# Patient Record
Sex: Male | Born: 1970 | Race: White | Hispanic: No | Marital: Single | Smoking: Former smoker
Health system: Southern US, Community
[De-identification: ages and names within clinical notes are randomized; demographics above are authoritative.]

## PROBLEM LIST (undated history)

## (undated) HISTORY — PX: OTHER SURGICAL HISTORY: SHX169

## (undated) HISTORY — PX: CHOLECYSTECTOMY: SHX55

## (undated) HISTORY — PX: CARPAL TUNNEL RELEASE: SHX101

---

## 2010-07-29 ENCOUNTER — Inpatient Hospital Stay (HOSPITAL_COMMUNITY)
Admission: EM | Admit: 2010-07-29 | Discharge: 2010-08-04 | Payer: Self-pay | Source: Home / Self Care | Admitting: Emergency Medicine

## 2010-08-21 ENCOUNTER — Encounter: Admission: RE | Admit: 2010-08-21 | Discharge: 2010-08-21 | Payer: Self-pay | Admitting: Neurosurgery

## 2010-09-18 ENCOUNTER — Encounter
Admission: RE | Admit: 2010-09-18 | Discharge: 2010-09-18 | Payer: Self-pay | Source: Home / Self Care | Attending: Neurosurgery | Admitting: Neurosurgery

## 2010-10-23 ENCOUNTER — Ambulatory Visit (HOSPITAL_COMMUNITY)
Admission: RE | Admit: 2010-10-23 | Discharge: 2010-10-23 | Payer: Self-pay | Source: Home / Self Care | Attending: Neurosurgery | Admitting: Neurosurgery

## 2010-11-29 ENCOUNTER — Other Ambulatory Visit (HOSPITAL_COMMUNITY): Payer: Self-pay | Admitting: Neurosurgery

## 2010-11-29 DIAGNOSIS — M542 Cervicalgia: Secondary | ICD-10-CM

## 2010-12-13 ENCOUNTER — Ambulatory Visit (HOSPITAL_COMMUNITY)
Admission: RE | Admit: 2010-12-13 | Discharge: 2010-12-13 | Disposition: A | Discharge status: Home / Self Care | Payer: Medicaid Other | Source: Ambulatory Visit | Attending: Neurosurgery | Admitting: Neurosurgery

## 2010-12-13 DIAGNOSIS — M5030 Other cervical disc degeneration, unspecified cervical region: Secondary | ICD-10-CM | POA: Insufficient documentation

## 2010-12-13 DIAGNOSIS — R209 Unspecified disturbances of skin sensation: Secondary | ICD-10-CM | POA: Insufficient documentation

## 2010-12-13 DIAGNOSIS — M542 Cervicalgia: Secondary | ICD-10-CM

## 2010-12-13 DIAGNOSIS — M79609 Pain in unspecified limb: Secondary | ICD-10-CM | POA: Insufficient documentation

## 2010-12-13 DIAGNOSIS — M4310 Spondylolisthesis, site unspecified: Secondary | ICD-10-CM | POA: Insufficient documentation

## 2010-12-19 LAB — BASIC METABOLIC PANEL
CO2: 27 mEq/L (ref 19–32)
Calcium: 8.5 mg/dL (ref 8.4–10.5)
Calcium: 9 mg/dL (ref 8.4–10.5)
Chloride: 100 mEq/L (ref 96–112)
Chloride: 102 mEq/L (ref 96–112)
Creatinine, Ser: 0.87 mg/dL (ref 0.4–1.5)
Creatinine, Ser: 0.9 mg/dL (ref 0.4–1.5)
GFR calc Af Amer: >60 mL/min (ref >60)
GFR calc Af Amer: >60 mL/min (ref >60)
GFR calc non Af Amer: >60 mL/min (ref >60)
GFR calc non Af Amer: >60 mL/min (ref >60)
Glucose, Bld: 111 mg/dL — ABNORMAL HIGH (ref 70–99)
Potassium: 4 mEq/L (ref 3.5–5.1)
Sodium: 135 mEq/L (ref 135–145)

## 2010-12-19 LAB — CARDIAC PANEL(CRET KIN+CKTOT+MB+TROPI)
Total CK: 318 U/L — ABNORMAL HIGH (ref 7–232)
Troponin I: <0.01 ng/mL (ref 0.00–0.06)

## 2010-12-19 LAB — CBC
HCT: 34.7 % — ABNORMAL LOW (ref 39.0–52.0)
HCT: 37.7 % — ABNORMAL LOW (ref 39.0–52.0)
Hemoglobin: 11.4 g/dL — ABNORMAL LOW (ref 13.0–17.0)
MCH: 29.1 pg (ref 26.0–34.0)
MCV: 88.7 fL (ref 78.0–100.0)
Platelets: 214 10*3/uL (ref 150–400)
Platelets: 228 10*3/uL (ref 150–400)
RBC: 4.24 MIL/uL (ref 4.22–5.81)
RBC: 4.37 MIL/uL (ref 4.22–5.81)
RDW: 13.6 % (ref 11.5–15.5)
WBC: 11.1 10*3/uL — ABNORMAL HIGH (ref 4.0–10.5)
WBC: 13 10*3/uL — ABNORMAL HIGH (ref 4.0–10.5)

## 2010-12-19 LAB — POCT I-STAT, CHEM 8
Glucose, Bld: 126 mg/dL — ABNORMAL HIGH (ref 70–99)
HCT: 45 % (ref 39.0–52.0)
Hemoglobin: 15.3 g/dL (ref 13.0–17.0)
Potassium: 3.8 mEq/L (ref 3.5–5.1)
Sodium: 140 mEq/L (ref 135–145)

## 2010-12-19 LAB — MRSA PCR SCREENING: MRSA by PCR: NEGATIVE

## 2011-01-03 ENCOUNTER — Encounter (HOSPITAL_COMMUNITY)
Admission: RE | Admit: 2011-01-03 | Discharge: 2011-01-03 | Disposition: A | Discharge status: Home / Self Care | Payer: Medicaid Other | Source: Ambulatory Visit | Attending: Neurosurgery | Admitting: Neurosurgery

## 2011-01-03 DIAGNOSIS — Z01812 Encounter for preprocedural laboratory examination: Secondary | ICD-10-CM | POA: Insufficient documentation

## 2011-01-03 LAB — CBC
HCT: 40.1 % (ref 39.0–52.0)
Hemoglobin: 13.9 g/dL (ref 13.0–17.0)
MCH: 28.6 pg (ref 26.0–34.0)
MCV: 82.5 fL (ref 78.0–100.0)
RBC: 4.86 MIL/uL (ref 4.22–5.81)

## 2011-01-03 LAB — BASIC METABOLIC PANEL
BUN: 15 mg/dL (ref 6–23)
CO2: 32 mEq/L (ref 19–32)
Chloride: 102 mEq/L (ref 96–112)
Creatinine, Ser: 0.81 mg/dL (ref 0.4–1.5)
Glucose, Bld: 107 mg/dL — ABNORMAL HIGH (ref 70–99)

## 2011-01-03 LAB — SURGICAL PCR SCREEN: MRSA, PCR: NEGATIVE

## 2011-01-09 ENCOUNTER — Inpatient Hospital Stay (HOSPITAL_COMMUNITY): Admission: RE | Admit: 2011-01-09 | Payer: Medicaid Other | Source: Ambulatory Visit | Admitting: Neurosurgery

## 2011-02-14 ENCOUNTER — Encounter (HOSPITAL_COMMUNITY)
Admission: RE | Admit: 2011-02-14 | Discharge: 2011-02-14 | Disposition: A | Discharge status: Home / Self Care | Payer: Medicaid Other | Source: Ambulatory Visit | Attending: Neurosurgery | Admitting: Neurosurgery

## 2011-02-14 ENCOUNTER — Ambulatory Visit (HOSPITAL_COMMUNITY)
Admission: RE | Admit: 2011-02-14 | Discharge: 2011-02-14 | Disposition: A | Discharge status: Home / Self Care | Payer: Medicaid Other | Source: Ambulatory Visit | Attending: Neurosurgery | Admitting: Neurosurgery

## 2011-02-14 ENCOUNTER — Other Ambulatory Visit (HOSPITAL_COMMUNITY): Payer: Self-pay | Admitting: Neurosurgery

## 2011-02-14 DIAGNOSIS — G5600 Carpal tunnel syndrome, unspecified upper limb: Secondary | ICD-10-CM | POA: Insufficient documentation

## 2011-02-14 DIAGNOSIS — Z01810 Encounter for preprocedural cardiovascular examination: Secondary | ICD-10-CM | POA: Insufficient documentation

## 2011-02-14 DIAGNOSIS — R0602 Shortness of breath: Secondary | ICD-10-CM | POA: Insufficient documentation

## 2011-02-14 DIAGNOSIS — R062 Wheezing: Secondary | ICD-10-CM | POA: Insufficient documentation

## 2011-02-14 DIAGNOSIS — Z01818 Encounter for other preprocedural examination: Secondary | ICD-10-CM | POA: Insufficient documentation

## 2011-02-14 DIAGNOSIS — M4802 Spinal stenosis, cervical region: Secondary | ICD-10-CM

## 2011-02-14 DIAGNOSIS — Z01812 Encounter for preprocedural laboratory examination: Secondary | ICD-10-CM | POA: Insufficient documentation

## 2011-02-14 LAB — DIFFERENTIAL
Eosinophils Relative: 5 % (ref 0–5)
Lymphocytes Relative: 24 % (ref 12–46)
Lymphs Abs: 2 10*3/uL (ref 0.7–4.0)
Monocytes Absolute: 0.6 10*3/uL (ref 0.1–1.0)
Monocytes Relative: 7 % (ref 3–12)

## 2011-02-14 LAB — CBC
HCT: 42.1 % (ref 39.0–52.0)
MCV: 84.4 fL (ref 78.0–100.0)
RDW: 13.8 % (ref 11.5–15.5)
WBC: 8.2 10*3/uL (ref 4.0–10.5)

## 2011-02-14 LAB — COMPREHENSIVE METABOLIC PANEL
Alkaline Phosphatase: 115 U/L (ref 39–117)
BUN: 21 mg/dL (ref 6–23)
Glucose, Bld: 97 mg/dL (ref 70–99)
Potassium: 4.6 mEq/L (ref 3.5–5.1)
Total Bilirubin: 0.4 mg/dL (ref 0.3–1.2)
Total Protein: 7 g/dL (ref 6.0–8.3)

## 2011-02-14 LAB — SURGICAL PCR SCREEN
MRSA, PCR: NEGATIVE
Staphylococcus aureus: POSITIVE — AB

## 2011-02-20 ENCOUNTER — Inpatient Hospital Stay (HOSPITAL_COMMUNITY)
Admission: RE | Admit: 2011-02-20 | Discharge: 2011-02-23 | DRG: 473 | Disposition: A | Discharge status: Home / Self Care | Payer: Medicaid Other | Source: Ambulatory Visit | Attending: Neurosurgery | Admitting: Neurosurgery

## 2011-02-20 ENCOUNTER — Inpatient Hospital Stay (HOSPITAL_COMMUNITY): Payer: Medicaid Other

## 2011-02-20 DIAGNOSIS — G5600 Carpal tunnel syndrome, unspecified upper limb: Secondary | ICD-10-CM | POA: Diagnosis present

## 2011-02-20 DIAGNOSIS — M47812 Spondylosis without myelopathy or radiculopathy, cervical region: Principal | ICD-10-CM | POA: Diagnosis present

## 2011-02-20 LAB — TYPE AND SCREEN
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
## 2011-03-27 NOTE — Discharge Summary (Signed)
  NAMESEVERINO, Joseph Sullivan              ACCOUNT NO.:  [id number]  MEDICAL RECORD NO.:  [id number]           PATIENT TYPE:  I  LOCATION:  3021                         FACILITY:  [facility]  PHYSICIAN:  Donalee Citrin, M.D.        DATE OF BIRTH:  1971/06/09  DATE OF ADMISSION:  02/20/2011 DATE OF DISCHARGE:  02/23/2011                              DISCHARGE SUMMARY   ADMITTING DIAGNOSIS:  Cervical spondylosis with history of cervical fracture at C7 with foraminotomies at C7 and C8 and posterior spinal fusion at C6 to T1 with a carpal tunnel release.  HOSPITAL COURSE:  The patient was admitted as an EMA, went to operating room, underwent the aforementioned procedure.  Postoperatively, the patient did very well, went to recovery room floor.  On the floor, the patient's pain significantly improved.  He was ambulating and voiding spontaneously.  He was able to be discharged home and was scheduled to follow up in approximately 2 weeks.  At the time of discharge, the patient is tolerating regular diet.  Incisions were clean.          ______________________________ Donalee Citrin, M.D.     GC/MEDQ  D:  03/08/2011  T:  03/08/2011  Job:  [id number]  Electronically Signed by Donalee Citrin M.D. on 03/27/2011 08:35:46 AM

## 2011-04-02 ENCOUNTER — Ambulatory Visit
Admission: RE | Admit: 2011-04-02 | Discharge: 2011-04-02 | Disposition: A | Discharge status: Home / Self Care | Payer: Medicaid Other | Source: Ambulatory Visit | Attending: Neurosurgery | Admitting: Neurosurgery

## 2011-04-02 ENCOUNTER — Other Ambulatory Visit: Payer: Self-pay | Admitting: Neurosurgery

## 2011-04-02 DIAGNOSIS — M542 Cervicalgia: Secondary | ICD-10-CM

## 2011-05-22 ENCOUNTER — Other Ambulatory Visit: Payer: Self-pay | Admitting: Neurosurgery

## 2011-05-22 ENCOUNTER — Ambulatory Visit
Admission: RE | Admit: 2011-05-22 | Discharge: 2011-05-22 | Disposition: A | Discharge status: Home / Self Care | Payer: Medicaid Other | Source: Ambulatory Visit | Attending: Neurosurgery | Admitting: Neurosurgery

## 2011-05-22 DIAGNOSIS — M542 Cervicalgia: Secondary | ICD-10-CM

## 2011-06-03 ENCOUNTER — Ambulatory Visit: Payer: Medicaid Other | Attending: Neurosurgery

## 2011-06-13 ENCOUNTER — Ambulatory Visit: Payer: Medicaid Other | Attending: Neurosurgery | Admitting: Rehabilitative and Restorative Service Providers"

## 2011-06-13 DIAGNOSIS — IMO0001 Reserved for inherently not codable concepts without codable children: Secondary | ICD-10-CM | POA: Insufficient documentation

## 2011-06-13 DIAGNOSIS — M2569 Stiffness of other specified joint, not elsewhere classified: Secondary | ICD-10-CM | POA: Insufficient documentation

## 2011-06-13 DIAGNOSIS — R293 Abnormal posture: Secondary | ICD-10-CM | POA: Insufficient documentation

## 2011-06-13 DIAGNOSIS — M542 Cervicalgia: Secondary | ICD-10-CM | POA: Insufficient documentation

## 2011-06-20 ENCOUNTER — Ambulatory Visit: Payer: Medicaid Other | Admitting: Rehabilitative and Restorative Service Providers"

## 2011-06-26 ENCOUNTER — Ambulatory Visit: Payer: Medicaid Other | Admitting: Rehabilitative and Restorative Service Providers"

## 2011-07-02 ENCOUNTER — Ambulatory Visit: Payer: Medicaid Other | Admitting: Rehabilitative and Restorative Service Providers"

## 2011-07-09 ENCOUNTER — Ambulatory Visit: Payer: Medicaid Other | Attending: Neurosurgery | Admitting: Rehabilitative and Restorative Service Providers"

## 2011-07-09 DIAGNOSIS — IMO0001 Reserved for inherently not codable concepts without codable children: Secondary | ICD-10-CM | POA: Insufficient documentation

## 2011-07-09 DIAGNOSIS — M2569 Stiffness of other specified joint, not elsewhere classified: Secondary | ICD-10-CM | POA: Insufficient documentation

## 2011-07-09 DIAGNOSIS — M542 Cervicalgia: Secondary | ICD-10-CM | POA: Insufficient documentation

## 2011-07-09 DIAGNOSIS — R293 Abnormal posture: Secondary | ICD-10-CM | POA: Insufficient documentation

## 2011-07-12 ENCOUNTER — Ambulatory Visit: Payer: Medicaid Other | Admitting: Rehabilitative and Restorative Service Providers"

## 2011-07-16 ENCOUNTER — Ambulatory Visit: Payer: Medicaid Other | Admitting: Rehabilitative and Restorative Service Providers"

## 2011-07-18 ENCOUNTER — Ambulatory Visit: Payer: Medicaid Other | Admitting: Rehabilitative and Restorative Service Providers"

## 2011-07-22 ENCOUNTER — Ambulatory Visit: Payer: Medicaid Other | Admitting: Rehabilitative and Restorative Service Providers"

## 2011-07-24 ENCOUNTER — Ambulatory Visit: Payer: Medicaid Other | Admitting: Rehabilitative and Restorative Service Providers"

## 2011-10-09 ENCOUNTER — Ambulatory Visit
Admission: RE | Admit: 2011-10-09 | Discharge: 2011-10-09 | Disposition: A | Discharge status: Home / Self Care | Payer: No Typology Code available for payment source | Source: Ambulatory Visit | Attending: Neurosurgery | Admitting: Neurosurgery

## 2011-10-09 ENCOUNTER — Other Ambulatory Visit: Payer: Self-pay | Admitting: Neurosurgery

## 2011-10-09 DIAGNOSIS — M542 Cervicalgia: Secondary | ICD-10-CM
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
## 2013-05-31 ENCOUNTER — Ambulatory Visit: Payer: Self-pay | Admitting: Emergency Medicine

## 2013-05-31 LAB — DOT URINE DIP
Blood: NEGATIVE
Protein: NEGATIVE
Specific Gravity: >=1.03 (ref 1.003–1.030)

## 2013-07-08 ENCOUNTER — Encounter (HOSPITAL_COMMUNITY): Payer: Self-pay | Admitting: Adult Health

## 2013-07-08 ENCOUNTER — Emergency Department (HOSPITAL_COMMUNITY)
Admission: EM | Admit: 2013-07-08 | Discharge: 2013-07-08 | Disposition: A | Discharge status: Home / Self Care | Payer: Self-pay | Attending: Emergency Medicine | Admitting: Emergency Medicine

## 2013-07-08 DIAGNOSIS — K921 Melena: Secondary | ICD-10-CM | POA: Insufficient documentation

## 2013-07-08 DIAGNOSIS — F17200 Nicotine dependence, unspecified, uncomplicated: Secondary | ICD-10-CM | POA: Insufficient documentation

## 2013-07-08 DIAGNOSIS — Z79899 Other long term (current) drug therapy: Secondary | ICD-10-CM | POA: Insufficient documentation

## 2013-07-08 DIAGNOSIS — K920 Hematemesis: Secondary | ICD-10-CM | POA: Insufficient documentation

## 2013-07-08 LAB — CBC
MCHC: 34.9 g/dL (ref 30.0–36.0)
MCV: 85 fL (ref 78.0–100.0)
Platelets: 243 10*3/uL (ref 150–400)
RBC: 3.54 MIL/uL — ABNORMAL LOW (ref 4.22–5.81)
RDW: 13.5 % (ref 11.5–15.5)
WBC: 8.2 10*3/uL (ref 4.0–10.5)

## 2013-07-08 LAB — COMPREHENSIVE METABOLIC PANEL
ALT: 11 U/L (ref 0–53)
AST: 11 U/L (ref 0–37)
Albumin: 3.3 g/dL — ABNORMAL LOW (ref 3.5–5.2)
Alkaline Phosphatase: 76 U/L (ref 39–117)
CO2: 27 mEq/L (ref 19–32)
Calcium: 9.1 mg/dL (ref 8.4–10.5)
Chloride: 105 mEq/L (ref 96–112)
Creatinine, Ser: 0.91 mg/dL (ref 0.50–1.35)
GFR calc non Af Amer: >90 mL/min (ref >90)
Sodium: 144 mEq/L (ref 135–145)
Total Bilirubin: 0.3 mg/dL (ref 0.3–1.2)
Total Protein: 6.4 g/dL (ref 6.0–8.3)

## 2013-07-08 LAB — OCCULT BLOOD, POC DEVICE: Fecal Occult Bld: NEGATIVE

## 2013-07-08 LAB — PROTIME-INR: INR: 1 (ref 0.00–1.49)

## 2013-07-08 MED ORDER — PANTOPRAZOLE SODIUM 20 MG PO TBEC: 20.0000 mg | DELAYED_RELEASE_TABLET | Freq: Every day | ORAL | Status: AC

## 2013-07-08 MED ORDER — PANTOPRAZOLE SODIUM 40 MG IV SOLR
40.0000 mg | Freq: Once | INTRAVENOUS | Status: AC
  Administered 2013-07-08: 40 mg via INTRAVENOUS
  Filled 2013-07-08: qty 40

## 2013-07-08 NOTE — ED Provider Notes (Signed)
CSN: [id number]     Arrival date & time 07/08/13  1809 History   First MD Initiated Contact with Patient 07/08/13 1959     Chief Complaint  Patient presents with  . Rectal Bleeding   (Consider location/radiation/quality/duration/timing/severity/associated sxs/prior Treatment) Patient is a 42 y.o. male presenting with vomiting.  Emesis Severity:  Moderate Duration:  1 day Timing:  Constant Emesis appearance: coffee grounds. Progression:  Unchanged Chronicity:  New Recent urination:  Normal Relieved by:  Nothing Worsened by:  Nothing tried Associated symptoms: no abdominal pain, no arthralgias, no chills, no diarrhea, no headaches and no sore throat   Associated symptoms comment:  Melena   History reviewed. No pertinent past medical history. Past Surgical History  Procedure Laterality Date  . Neck fusion    . Carpal tunnel release    . Cholecystectomy     History reviewed. No pertinent family history. History  Substance Use Topics  . Smoking status: Current Every Day Smoker    Types: Cigarettes  . Smokeless tobacco: Not on file  . Alcohol Use: Yes    Review of Systems  Constitutional: Negative for fever and chills.  HENT: Negative for congestion, sore throat and rhinorrhea.   Eyes: Negative for photophobia and visual disturbance.  Respiratory: Negative for cough and shortness of breath.   Cardiovascular: Negative for chest pain and leg swelling.  Gastrointestinal: Positive for vomiting. Negative for nausea, abdominal pain, diarrhea and constipation.  Endocrine: Negative for polydipsia and polyuria.  Genitourinary: Negative for dysuria and hematuria.  Musculoskeletal: Negative for back pain and arthralgias.  Skin: Negative for color change and rash.  Neurological: Negative for dizziness, syncope, light-headedness and headaches.  Hematological: Negative for adenopathy. Does not bruise/bleed easily.  All other systems reviewed and are negative.    Allergies  Review  of patient's allergies indicates no known allergies.  Home Medications   Current Outpatient Rx  Name  Route  Sig  Dispense  Refill  . pantoprazole (PROTONIX) 20 MG tablet   Oral   Take 1 tablet (20 mg total) by mouth daily.   30 tablet   0    BP 100/60  Pulse 55  Temp(Src) 98.2 F (36.8 C) (Oral)  Resp 16  SpO2 99% Physical Exam  Vitals reviewed. Constitutional: He is oriented to person, place, and time. He appears well-developed and well-nourished.  HENT:  Head: Normocephalic and atraumatic.  Eyes: Conjunctivae and EOM are normal.  Neck: Normal range of motion. Neck supple.  Cardiovascular: Normal rate, regular rhythm and normal heart sounds.   Pulmonary/Chest: Effort normal and breath sounds normal. No respiratory distress.  Abdominal: He exhibits no distension. There is no tenderness. There is no rebound and no guarding.  Genitourinary: Guaiac negative stool.  Musculoskeletal: Normal range of motion.  Neurological: He is alert and oriented to person, place, and time.  Skin: Skin is warm and dry.    ED Course  Procedures (including critical care time) Labs Review Labs Reviewed  CBC - Abnormal; Notable for the following:    RBC 3.54 (*)    Hemoglobin 10.5 (*)    HCT 30.1 (*)    All other components within normal limits  COMPREHENSIVE METABOLIC PANEL - Abnormal; Notable for the following:    Glucose, Bld 122 (*)    Albumin 3.3 (*)    All other components within normal limits  PROTIME-INR  OCCULT BLOOD, POC DEVICE   Results for orders placed during the hospital encounter of 07/08/13  CBC  Result Value Range   WBC 8.2  4.0 - 10.5 K/uL   RBC 3.54 (*) 4.22 - 5.81 MIL/uL   Hemoglobin 10.5 (*) 13.0 - 17.0 g/dL   HCT 40.9 (*) 81.1 - 91.4 %   MCV 85.0  78.0 - 100.0 fL   MCH 29.7  26.0 - 34.0 pg   MCHC 34.9  30.0 - 36.0 g/dL   RDW 78.2  95.6 - 21.3 %   Platelets 243  150 - 400 K/uL  COMPREHENSIVE METABOLIC PANEL      Result Value Range   Sodium 144  135 -  145 mEq/L   Potassium 3.7  3.5 - 5.1 mEq/L   Chloride 105  96 - 112 mEq/L   CO2 27  19 - 32 mEq/L   Glucose, Bld 122 (*) 70 - 99 mg/dL   BUN 20  6 - 23 mg/dL   Creatinine, Ser 0.86  0.50 - 1.35 mg/dL   Calcium 9.1  8.4 - 57.8 mg/dL   Total Protein 6.4  6.0 - 8.3 g/dL   Albumin 3.3 (*) 3.5 - 5.2 g/dL   AST 11  0 - 37 U/L   ALT 11  0 - 53 U/L   Alkaline Phosphatase 76  39 - 117 U/L   Total Bilirubin 0.3  0.3 - 1.2 mg/dL   GFR calc non Af Amer >90  >90 mL/min   GFR calc Af Amer >90  >90 mL/min  PROTIME-INR      Result Value Range   Prothrombin Time 13.0  11.6 - 15.2 seconds   INR 1.00  0.00 - 1.49  OCCULT BLOOD, POC DEVICE      Result Value Range   Fecal Occult Bld NEGATIVE  NEGATIVE    Imaging Review No results found.  MDM   1. Hematemesis   2. Melena    42 y.o. male  without pertinent PMH presents with coffee ground emesis and melena x 1 day which occurred 4 days ago.  Pt states that he does not normally drink alcohol, but had significant consumption for 2 or 3 days before that.  States that he drank for 2-3 days then had onset of vomiting and melena, which then abated, however pt has not had BM since that time.  Physical exam on arrival as above, hemoccult negative stool (however only very small amount of stool was able to be obtained).  Hg as above 10.5 from normal baseline.  Coags unremarkable. Spoke with GI, who will arrange for outpt endoscopy, likely in am.   Feel this appropriate given lack of upper gi symptoms for 4 days.  Pt given standard return precautions.  DC home  Labs and imaging as above reviewed by myself and attending,Dr. Wilkie Aye, with whom case was discussed.   1. Hematemesis   2. Melena         Noel Gerold, MD 07/09/13 0111

## 2013-07-08 NOTE — ED Notes (Signed)
Presents with 4 coffee ground emesis on Sunday, pt states he was drinking pepsi at the time, and black stool x1 yesterday associated with lower abdominal pain and nausea. Pain rated 3-4/10. Pt alert and oriented VSS.

## 2013-07-09 NOTE — ED Provider Notes (Signed)
I evaluated the patient, reviewed the resident's note and I agree with the findings and plan.  This a 42 year old male who presents with coffee-ground emesis and melena for one day. He has not had symptoms for 4 days.  He is nontoxic-appearing and vital signs are within normal limits. Per resident rectal exam, patient had guaiac-negative stool.  Patient's left hgb was 14.5.  This is a 2012. Hemoglobin today is 10.5. Patient was discussed with GI on call. Given that the patient has not had symptoms in 4 days and is hemodynamically stable with normal orthostatics, patient will be set up for outpatient endoscopy tomorrow. Patient was given strict return precautions.  Shon Baton, MD 07/09/13 [phone]

## 2017-12-13 ENCOUNTER — Encounter (HOSPITAL_COMMUNITY): Payer: Self-pay | Admitting: Emergency Medicine

## 2017-12-13 ENCOUNTER — Other Ambulatory Visit: Payer: Self-pay

## 2017-12-13 ENCOUNTER — Emergency Department (HOSPITAL_COMMUNITY)
Admission: EM | Admit: 2017-12-13 | Discharge: 2017-12-13 | Disposition: A | Discharge status: Home / Self Care | Payer: BLUE CROSS/BLUE SHIELD | Attending: Emergency Medicine | Admitting: Emergency Medicine

## 2017-12-13 DIAGNOSIS — Y990 Civilian activity done for income or pay: Secondary | ICD-10-CM | POA: Diagnosis not present

## 2017-12-13 DIAGNOSIS — W208XXA Other cause of strike by thrown, projected or falling object, initial encounter: Secondary | ICD-10-CM | POA: Insufficient documentation

## 2017-12-13 DIAGNOSIS — Y9389 Activity, other specified: Secondary | ICD-10-CM | POA: Insufficient documentation

## 2017-12-13 DIAGNOSIS — S0502XA Injury of conjunctiva and corneal abrasion without foreign body, left eye, initial encounter: Secondary | ICD-10-CM | POA: Diagnosis not present

## 2017-12-13 DIAGNOSIS — Y9289 Other specified places as the place of occurrence of the external cause: Secondary | ICD-10-CM | POA: Diagnosis not present

## 2017-12-13 DIAGNOSIS — Z87891 Personal history of nicotine dependence: Secondary | ICD-10-CM | POA: Insufficient documentation

## 2017-12-13 DIAGNOSIS — H5712 Ocular pain, left eye: Secondary | ICD-10-CM | POA: Diagnosis present

## 2017-12-13 MED ORDER — ERYTHROMYCIN 5 MG/GM OP OINT: TOPICAL_OINTMENT | OPHTHALMIC | 0 refills | Status: AC

## 2017-12-13 MED ORDER — TETRACAINE HCL 0.5 % OP SOLN
2.0000 [drp] | Freq: Once | OPHTHALMIC | Status: AC
  Administered 2017-12-13: 2 [drp] via OPHTHALMIC
  Filled 2017-12-13: qty 4

## 2017-12-13 MED ORDER — FLUORESCEIN SODIUM 1 MG OP STRP
1.0000 | ORAL_STRIP | Freq: Once | OPHTHALMIC | Status: AC
  Administered 2017-12-13: 1 via OPHTHALMIC
  Filled 2017-12-13: qty 1

## 2017-12-13 NOTE — ED Triage Notes (Signed)
Patint presents to ED for assessment of left eye pain and black speck on his lens.  Patient c/o pain since last night.  Denies known injury.  C/o some blurry vision in left eye,.

## 2017-12-13 NOTE — ED Notes (Signed)
Called for patient.  Currently in restroom.  Will call again shortly.

## 2017-12-13 NOTE — ED Provider Notes (Signed)
[facility]   CSN: [id number] Arrival date & time: 12/13/17  1228     History   Chief Complaint Chief Complaint  Patient presents with  . Eye Pain    HPI Joseph Sullivan is a 47 y.o. male.  The history is provided by the patient and medical records. No language interpreter was used.  Eye Pain    Joseph Sullivan is a 47 y.o. male who presents to the Emergency Department complaining of acute onset of left eye pain yesterday.  Intermittently had blurry vision yesterday, but none today.  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  He noticed a brown speck near his pupil when he looked in the mirror after pain began.  He flushed the eye out with water.  This morning, pain was persistent, therefore he came to the emergency department for further evaluation.  No pain with movement of the eye.  No headache.  No redness or discharge.  No cough, congestion or fevers.  Has an optometrist who does his glasses, but no ophthalmologist.  He is not a contact lens wearer.  History reviewed. No pertinent past medical history.  There are no active problems to display for this patient.   Past Surgical History:  Procedure Laterality Date  . CARPAL TUNNEL RELEASE    . CHOLECYSTECTOMY    . neck fusion         Home Medications    Prior to Admission medications   Medication Sig Start Date End Date Taking? Authorizing Provider  erythromycin ophthalmic ointment Place a 1/2 inch ribbon of ointment into the lower eyelid 4-6 times daily 12/13/17   Ward, Chase PicketJaime Pilcher, PA-C  pantoprazole (PROTONIX) 20 MG tablet Take 1 tablet (20 mg total) by mouth daily. 07/08/13   Mirian MoGentry, Matthew, MD    Family History History reviewed. No pertinent family history.  Social History Social History   Tobacco Use  . Smoking status: Former Smoker    Types:  Cigarettes  . Smokeless tobacco: Never Used  Substance Use Topics  . Alcohol use: Yes  . Drug use: No     Allergies   Patient has no known allergies.   Review of Systems Review of Systems  Constitutional: Negative for chills and fever.  HENT: Negative for congestion, ear pain and sore throat.   Eyes: Positive for pain and visual disturbance (Resolved). Negative for photophobia, discharge, redness and itching.  Respiratory: Negative for cough.      Physical Exam Updated Vital Signs BP (!) 119/107 (BP Location: Right Arm)   Pulse (!) 54   Temp 97.7 F (36.5 C) (Oral)   Resp 18   SpO2 99%   Physical Exam  Constitutional: He appears well-developed and well-nourished. No distress.  HENT:  Head: Normocephalic and atraumatic.  Eyes: Conjunctivae are normal. Pupils are equal, round, and reactive to light. Right eye exhibits no discharge. Left eye exhibits no discharge.  20/20 right, left and bilateral. EOMI without pain.  No consensual photophobia.  Pinpoint brown speck foreign body just to the right of the left pupil.  Fluorscein uptake consistent with abrasion to the lower cornea.   Neck: Neck supple.  Cardiovascular: Normal rate, regular rhythm and normal heart sounds.  No murmur heard. Pulmonary/Chest: Effort normal and breath sounds normal. No respiratory distress. He has no wheezes. He has no rales.  Musculoskeletal: Normal range  of motion.  Neurological: He is alert.  Skin: Skin is warm and dry.  Nursing note and vitals reviewed.    ED Treatments / Results  Labs (all labs ordered are listed, but only abnormal results are displayed) Labs Reviewed - No data to display  EKG  EKG Interpretation None       Radiology No results found.  Procedures .Foreign Body Removal Date/Time: 12/13/2017 6:36 PM Performed by: Ward, Chase Picket, PA-C Authorized by: Ward, Chase Picket, PA-C  Consent: Verbal consent obtained. Risks and benefits: risks, benefits and  alternatives were discussed Consent given by: patient Patient understanding: patient states understanding of the procedure being performed Patient identity confirmed: verbally with patient and arm band Body area: eye Location details: left cornea  Anesthesia: Local Anesthetic: tetracaine drops  Sedation: Patient sedated: no  Patient restrained: no Patient cooperative: yes Localization method: visualized Removal mechanism: moist cotton swab Eye examined with fluorescein. Fluorescein uptake. Corneal abrasion size: small Corneal abrasion location: inferior No residual rust ring present. Dressing: antibiotic ointment Depth: superficial Complexity: simple 1 objects recovered. Objects recovered: Brown speck, likely dry wall particle  Post-procedure assessment: foreign body removed Patient tolerance: Patient tolerated the procedure well with no immediate complications   (including critical care time)  Medications Ordered in ED Medications  tetracaine (PONTOCAINE) 0.5 % ophthalmic solution 2 drop (not administered)  fluorescein ophthalmic strip 1 strip (not administered)     Initial Impression / Assessment and Plan / ED Course  I have reviewed the triage vital signs and the nursing notes.  Pertinent labs & imaging results that were available during my care of the patient were reviewed by me and considered in my medical decision making (see chart for details).    Joseph Sullivan is a 47 y.o. male who presents to ED for acute onset of left eye pain yesterday while working on drywall.  No infectious symptoms.  No consensual photophobia or pain with extraocular movements.  Did have small foreign body just to the right of the pupil which was removed with moist cotton swab as dictated above.  He also had fluorescein uptake c/w corneal abrasion on exam to the inferior aspect of the corea. Exam not concerning for orbital cellulitis or hyphema. No concern for uveitis. Patient will be  discharged home with erythromycin ointment.  Patient understands to follow up with ophthalmology. Return precautions discussed. Patient appears safe for discharge and all questions were answered.  Final Clinical Impressions(s) / ED Diagnoses   Final diagnoses:  Abrasion of left cornea, initial encounter    ED Discharge Orders        Ordered    erythromycin ophthalmic ointment     12/13/17 1759       Ward, Chase Picket, PA-C 12/13/17 1839    Alvira Monday, MD 12/14/17 1416

## 2017-12-13 NOTE — Discharge Instructions (Signed)
It was my pleasure taking care of you today!   Apply ointment to the eye as directed.   You should follow up with the eye doctor listed below. Call Monday morning to schedule this appointment.  Dr. Fredderick PhenixLisa Sun  [facility] [address] [phone]  Return to ER for new or worsening symptoms, any additional concerns.

## 2018-02-03 DIAGNOSIS — R109 Unspecified abdominal pain: Secondary | ICD-10-CM

## 2023-07-11 DIAGNOSIS — H6690 Otitis media, unspecified, unspecified ear: Secondary | ICD-10-CM | POA: Diagnosis not present

## 2023-08-14 DIAGNOSIS — B354 Tinea corporis: Secondary | ICD-10-CM | POA: Diagnosis not present

## 2023-08-14 DIAGNOSIS — L300 Nummular dermatitis: Secondary | ICD-10-CM | POA: Diagnosis not present
